# Patient Record
Sex: Male | Born: 1995
Health system: Southern US, Community
[De-identification: ages and names within clinical notes are randomized; demographics above are authoritative.]

---
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
## 2013-08-24 ENCOUNTER — Emergency Department: Payer: Self-pay | Admitting: Emergency Medicine

## 2018-09-16 DIAGNOSIS — S62340A Nondisplaced fracture of base of second metacarpal bone, right hand, initial encounter for closed fracture: Secondary | ICD-10-CM | POA: Insufficient documentation

## 2019-09-02 ENCOUNTER — Other Ambulatory Visit: Payer: Self-pay

## 2019-09-02 ENCOUNTER — Ambulatory Visit: Payer: HRSA Program | Attending: Internal Medicine

## 2019-09-02 DIAGNOSIS — Z20828 Contact with and (suspected) exposure to other viral communicable diseases: Secondary | ICD-10-CM | POA: Diagnosis not present

## 2019-09-02 DIAGNOSIS — Z20822 Contact with and (suspected) exposure to covid-19: Secondary | ICD-10-CM

## 2019-09-03 NOTE — Progress Notes (Signed)
Order(s) created erroneously. Erroneous order ID: [id number]  Order moved by: Lynzy Rawles M  Order move date/time: 09/03/2019 6:56 PM  Source Patient: [id number]  Source Contact: 09/02/2019  Destination Patient: [id number]  Destination Contact: 09/02/2019 

## 2019-09-03 NOTE — Progress Notes (Signed)
Order(s) created erroneously. Erroneous order ID: [id number]  Order moved by: Brigitte Pulse  Order move date/time: 09/03/2019 6:56 PM  Source Patient: [id number]  Source Contact: 09/02/2019  Destination Patient: [id number]  Destination Contact: 09/02/2019

## 2019-09-03 NOTE — Progress Notes (Signed)
Moving orders to this encounter.  

## 2019-09-04 LAB — NOVEL CORONAVIRUS, NAA: SARS-CoV-2, NAA: NOT DETECTED

## 2020-04-08 ENCOUNTER — Other Ambulatory Visit: Payer: Self-pay

## 2020-04-08 DIAGNOSIS — F10929 Alcohol use, unspecified with intoxication, unspecified: Secondary | ICD-10-CM | POA: Insufficient documentation

## 2020-04-08 LAB — COMPREHENSIVE METABOLIC PANEL
ALT: 62 U/L — ABNORMAL HIGH (ref 0–44)
AST: 39 U/L (ref 15–41)
Albumin: 4.8 g/dL (ref 3.5–5.0)
Alkaline Phosphatase: 78 U/L (ref 38–126)
Anion gap: 13 (ref 5–15)
BUN: 15 mg/dL (ref 6–20)
CO2: 22 mmol/L (ref 22–32)
Calcium: 8.8 mg/dL — ABNORMAL LOW (ref 8.9–10.3)
Chloride: 106 mmol/L (ref 98–111)
Creatinine, Ser: 0.91 mg/dL (ref 0.61–1.24)
GFR calc Af Amer: >60 mL/min (ref >60)
GFR calc non Af Amer: >60 mL/min (ref >60)
Glucose, Bld: 122 mg/dL — ABNORMAL HIGH (ref 70–99)
Potassium: 3.2 mmol/L — ABNORMAL LOW (ref 3.5–5.1)
Sodium: 141 mmol/L (ref 135–145)
Total Bilirubin: 0.6 mg/dL (ref 0.3–1.2)
Total Protein: 8 g/dL (ref 6.5–8.1)

## 2020-04-08 LAB — CBC
HCT: 41.4 % (ref 39.0–52.0)
Hemoglobin: 14.4 g/dL (ref 13.0–17.0)
MCH: 32.5 pg (ref 26.0–34.0)
MCHC: 34.8 g/dL (ref 30.0–36.0)
MCV: 93.5 fL (ref 80.0–100.0)
Platelets: 280 10*3/uL (ref 150–400)
RBC: 4.43 MIL/uL (ref 4.22–5.81)
RDW: 11.9 % (ref 11.5–15.5)
WBC: 11 10*3/uL — ABNORMAL HIGH (ref 4.0–10.5)
nRBC: 0 % (ref 0.0–0.2)

## 2020-04-08 LAB — SALICYLATE LEVEL: Salicylate Lvl: <7 mg/dL — ABNORMAL LOW (ref 7.0–30.0)

## 2020-04-08 LAB — ETHANOL: Alcohol, Ethyl (B): 227 mg/dL — ABNORMAL HIGH (ref <10)

## 2020-04-08 LAB — ACETAMINOPHEN LEVEL: Acetaminophen (Tylenol), Serum: <10 ug/mL — ABNORMAL LOW (ref 10–30)

## 2020-04-08 MED ORDER — SODIUM CHLORIDE 0.9 % IV BOLUS
1000.0000 mL | Freq: Once | INTRAVENOUS | Status: AC
  Administered 2020-04-08: 1000 mL via INTRAVENOUS

## 2020-04-08 MED ORDER — ONDANSETRON HCL 4 MG/2ML IJ SOLN
INTRAMUSCULAR | Status: AC
  Administered 2020-04-08: 4 mg via INTRAVENOUS
  Filled 2020-04-08: qty 2

## 2020-04-08 MED ORDER — ONDANSETRON HCL 4 MG/2ML IJ SOLN: 4.0000 mg | Freq: Once | INTRAMUSCULAR | Status: AC

## 2020-04-08 NOTE — ED Triage Notes (Signed)
Patient brought in by friends for alcohol intoxication. Patient unconscious at arrival to this ED. Patient awakened while trying to transfer patient from vehicle. Patient unconsciouis for approx 30 minutes. Patient actively vomiting at this time. Patient unsure of how much he drank tonight.

## 2020-04-09 ENCOUNTER — Emergency Department
Admission: EM | Admit: 2020-04-09 | Discharge: 2020-04-09 | Disposition: A | Discharge status: Home / Self Care | Payer: Self-pay | Attending: Emergency Medicine | Admitting: Emergency Medicine

## 2020-04-09 DIAGNOSIS — F10929 Alcohol use, unspecified with intoxication, unspecified: Secondary | ICD-10-CM

## 2020-04-09 NOTE — ED Notes (Signed)
Patient's mother dropped off bag of clean clothing for him. Placed on stretcher beside of patient.

## 2020-04-09 NOTE — ED Provider Notes (Signed)
[facility] Emergency Department Provider Note  ____________________________________________  Time seen: Approximately 4:00 AM  I have reviewed the triage vital signs and the nursing notes.   HISTORY  Chief Complaint Alcohol Intoxication   HPI Blake Morales. is a 24 y.o. male was brought in by his uncle for alcohol intoxication.  Patient reports that he went to a restaurant by himself.  He had a few drinks and he was eating.  He reports that he does not remember what happened after that.  According to the uncle patient called because he was intoxicated.  On arrival patient was unresponsive in the car.  While being moved into the emergency room patient became more awake responsive.  No respiratory depression.  At this time patient has been sobering up for 6 hours.  He has been reassessed and denies any drug use.  He denies any history of alcoholism.  He denies any headache, chest pain, abdominal pain.  Patient feels back to baseline.   PMH None - reviewed  Allergies Patient has no known allergies.  No family history on file.  Social History Social History   Tobacco Use  . Smoking status: Never Smoker  . Smokeless tobacco: Never Used  Substance Use Topics  . Alcohol use: Yes  . Drug use: Not on file    Review of Systems  Constitutional: Negative for fever. + alcohol intoxication Eyes: Negative for visual changes. ENT: Negative for sore throat. Neck: No neck pain  Cardiovascular: Negative for chest pain. Respiratory: Negative for shortness of breath. Gastrointestinal: Negative for abdominal pain or diarrhea. + N.V Genitourinary: Negative for dysuria. Musculoskeletal: Negative for back pain. Skin: Negative for rash. Neurological: Negative for headaches, weakness or numbness. Psych: No SI or HI  ____________________________________________   PHYSICAL EXAM:  VITAL SIGNS: ED Triage Vitals  Enc Vitals Group     BP 04/08/20 2228 123/78      Pulse Rate 04/08/20 2228 81     Resp 04/08/20 2228 12     Temp 04/08/20 2228 97.9 F (36.6 C)     Temp Source 04/08/20 2228 Axillary     SpO2 04/08/20 2228 91 %     Weight 04/08/20 2218 175 lb (79.4 kg)     Height 04/08/20 2218 5\' 4"  (1.626 m)     Head Circumference --      Peak Flow --      Pain Score 04/08/20 2218 0     Pain Loc --      Pain Edu? --      Excl. in GC? --     Constitutional: Alert and oriented. Well appearing and in no apparent distress. HEENT:      Head: Normocephalic and atraumatic.         Eyes: Conjunctivae are normal. Sclera is non-icteric.       Mouth/Throat: Mucous membranes are moist.       Neck: Supple with no signs of meningismus. Cardiovascular: Regular rate and rhythm. No murmurs, gallops, or rubs.  Respiratory: Normal respiratory effort. Lungs are clear to auscultation bilaterally.  Gastrointestinal: Soft, non tender. Musculoskeletal: No edema, cyanosis, or erythema of extremities. Neurologic: Normal speech and language. Face is symmetric. Moving all extremities. No gross focal neurologic deficits are appreciated. Skin: Skin is warm, dry and intact. No rash noted. Psychiatric: Mood and affect are normal. Speech and behavior are normal.  ____________________________________________   LABS (all labs ordered are listed, but only abnormal results are displayed)  Labs Reviewed  COMPREHENSIVE METABOLIC PANEL - Abnormal; Notable for the following components:      Result Value   Potassium 3.2 (*)    Glucose, Bld 122 (*)    Calcium 8.8 (*)    ALT 62 (*)    All other components within normal limits  ETHANOL - Abnormal; Notable for the following components:   Alcohol, Ethyl (B) 227 (*)    All other components within normal limits  SALICYLATE LEVEL - Abnormal; Notable for the following components:   Salicylate Lvl <7.0 (*)    All other components within normal limits  ACETAMINOPHEN LEVEL - Abnormal; Notable for the following components:    Acetaminophen (Tylenol), Serum <10 (*)    All other components within normal limits  CBC - Abnormal; Notable for the following components:   WBC 11.0 (*)    All other components within normal limits  URINE DRUG SCREEN, QUALITATIVE ([facility] ONLY)   ____________________________________________  EKG  none  ____________________________________________  RADIOLOGY  none  ____________________________________________   PROCEDURES  Procedure(s) performed: None Procedures Critical Care performed:  None ____________________________________________   INITIAL IMPRESSION / ASSESSMENT AND PLAN / ED COURSE  24 y.o. male was brought in by his uncle for alcohol intoxication.  Patient has several drinks tonight at a restaurant.  Upon arrival patient was extremely intoxicated but always maintaining his airway.  He was monitored for 6 hours and is now completely sober.  He is ambulatory.  He is tolerating p.o.  He has no medical complaints.  This was an accidental overdose.  No suicide intent.  This is never happened patient before.  He is going to be discharged down to the care of his uncle.  Discussed harms associated with heavy consumption of alcohol.  Old medical records reviewed.      _____________________________________________ Please note:  Patient was evaluated in Emergency Department today for the symptoms described in the history of present illness. Patient was evaluated in the context of the global COVID-19 pandemic, which necessitated consideration that the patient might be at risk for infection with the SARS-CoV-2 virus that causes COVID-19. Institutional protocols and algorithms that pertain to the evaluation of patients at risk for COVID-19 are in a state of rapid change based on information released by regulatory bodies including the CDC and federal and state organizations. These policies and algorithms were followed during the patient's care in the ED.  Some ED evaluations and  interventions may be delayed as a result of limited staffing during the pandemic.   [place] Controlled Substance Database was reviewed by me. ____________________________________________   FINAL CLINICAL IMPRESSION(S) / ED DIAGNOSES   Final diagnoses:  Alcoholic intoxication with complication (HCC)      NEW MEDICATIONS STARTED DURING THIS VISIT:  ED Discharge Orders    None       Note:  This document was prepared using Dragon voice recognition software and may include unintentional dictation errors.    Don Perking, [place], MD 04/09/20 [phone]

## 2020-04-09 NOTE — ED Notes (Signed)
Spoke with patient's mother who is coming to pick him up. Will go over discharge papers with patient when mother arrives.

## 2020-04-09 NOTE — ED Notes (Signed)
Patient continues to sleep soundly. Will arouse to touch.

## 2020-04-09 NOTE — ED Notes (Signed)
Patient awake and alert. Cannot recall exactly what happed at the restaurant but states he was by himself and stood up to go to the bathroom and "I guess it just all hit me." Able to ambulate to the bathroom without assistance. MD to bedside to evaluate and discharge patient. Will call his mother to pick him up. Patient given clothes that his mother brought to change into.

## 2020-11-19 ENCOUNTER — Emergency Department (HOSPITAL_BASED_OUTPATIENT_CLINIC_OR_DEPARTMENT_OTHER)
Admission: EM | Admit: 2020-11-19 | Discharge: 2020-11-20 | Disposition: A | Discharge status: Home / Self Care | Payer: Self-pay | Attending: Emergency Medicine | Admitting: Emergency Medicine

## 2020-11-19 ENCOUNTER — Encounter (HOSPITAL_BASED_OUTPATIENT_CLINIC_OR_DEPARTMENT_OTHER): Payer: Self-pay | Admitting: *Deleted

## 2020-11-19 ENCOUNTER — Other Ambulatory Visit: Payer: Self-pay

## 2020-11-19 DIAGNOSIS — S0012XA Contusion of left eyelid and periocular area, initial encounter: Secondary | ICD-10-CM

## 2020-11-19 DIAGNOSIS — W500XXA Accidental hit or strike by another person, initial encounter: Secondary | ICD-10-CM | POA: Insufficient documentation

## 2020-11-19 DIAGNOSIS — S01112A Laceration without foreign body of left eyelid and periocular area, initial encounter: Secondary | ICD-10-CM

## 2020-11-19 DIAGNOSIS — Y9369 Activity, other involving other sports and athletics played as a team or group: Secondary | ICD-10-CM | POA: Insufficient documentation

## 2020-11-19 MED ORDER — LIDOCAINE-EPINEPHRINE 2 %-1:100000 IJ SOLN
20.0000 mL | Freq: Once | INTRAMUSCULAR | Status: AC
  Administered 2020-11-20: 20 mL

## 2020-11-19 MED ORDER — LIDOCAINE-EPINEPHRINE (PF) 2 %-1:200000 IJ SOLN
INTRAMUSCULAR | Status: AC
  Filled 2020-11-19: qty 20

## 2020-11-19 NOTE — ED Provider Notes (Incomplete)
   [facility] Provider Note: [name], MD, FACEP  CSN: [id number] MRN: [id number] ARRIVAL: 11/19/20 at 2258 ROOM: MH12/MH12   CHIEF COMPLAINT  Laceration   HISTORY OF PRESENT ILLNESS  11/19/20 11:39 PM Blake Morales. is a 25 y.o. male who collided with another player while playing laser tag earlier this evening.  He has a laceration of the left eyebrow.  Bleeding is controlled.  He rates associated pain is a 3 out of 10, worse with movement or palpation.    History reviewed. No pertinent past medical history.  History reviewed. No pertinent surgical history.  No family history on file.  Social History   Tobacco Use  . Smoking status: Never Smoker  . Smokeless tobacco: Never Used  Vaping Use  . Vaping Use: Never used  Substance Use Topics  . Alcohol use: Yes  . Drug use: Never    Prior to Admission medications   Not on File    Allergies Patient has no known allergies.   REVIEW OF SYSTEMS  Negative except as noted here or in the History of Present Illness.   PHYSICAL EXAMINATION  Initial Vital Signs Blood pressure 124/74, pulse (!) 130, temperature 98.3 F (36.8 C), temperature source Oral, resp. rate 20, height 5\' 4"  (1.626 m), weight 93 kg, SpO2 100 %.  Examination General: Well-developed, well-nourished male in no acute distress; appearance consistent with age of record HENT: normocephalic; atraumatic Eyes: pupils equal, round and reactive to light; extraocular muscles intact Neck: supple Heart: regular rate and rhythm; no murmurs, rubs or gallops Lungs: clear to auscultation bilaterally Abdomen: soft; nondistended; nontender; no masses or hepatosplenomegaly; bowel sounds present Extremities: No deformity; full range of motion; pulses normal Neurologic: Awake, alert and oriented; motor function intact in all extremities and symmetric; no facial droop Skin: Warm and dry Psychiatric: Normal mood and affect   RESULTS  Summary of  this visit's results, reviewed and interpreted by myself:   EKG Interpretation  Date/Time:    Ventricular Rate:    PR Interval:    QRS Duration:   QT Interval:    QTC Calculation:   R Axis:     Text Interpretation:        Laboratory Studies: No results found for this or any previous visit (from the past 24 hour(s)). Imaging Studies: No results found.  ED COURSE and MDM  Nursing notes, initial and subsequent vitals signs, including pulse oximetry, reviewed and interpreted by myself.  Vitals:   11/19/20 2309 11/19/20 2310  BP: 124/74   Pulse: (!) 130   Resp: 20   Temp: 98.3 F (36.8 C)   TempSrc: Oral   SpO2: 100%   Weight:  93 kg  Height:  5\' 4"  (1.626 m)   Medications - No data to display    PROCEDURES  Procedures   ED DIAGNOSES  No diagnosis found.

## 2020-11-19 NOTE — ED Provider Notes (Signed)
[facility] Provider Note: [name], MD, FACEP  CSN: [id number] MRN: [id number] ARRIVAL: 11/19/20 at 2258 ROOM: MH12/MH12   CHIEF COMPLAINT  Laceration   HISTORY OF PRESENT ILLNESS  11/19/20 11:39 PM Blake Morales. is a 25 y.o. male who collided with another player while playing laser tag earlier this evening.  He has a laceration of the left eyebrow.  Bleeding is controlled.  He rates associated pain is a 3 out of 10, worse with movement or palpation.  Tetanus is believed to be up-to-date as he was in the [employer].   History reviewed. No pertinent past medical history.  History reviewed. No pertinent surgical history.  No family history on file.  Social History   Tobacco Use  . Smoking status: Never Smoker  . Smokeless tobacco: Never Used  Vaping Use  . Vaping Use: Never used  Substance Use Topics  . Alcohol use: Yes  . Drug use: Never    Prior to Admission medications   Not on File    Allergies Patient has no known allergies.   REVIEW OF SYSTEMS  Negative except as noted here or in the History of Present Illness.   PHYSICAL EXAMINATION  Initial Vital Signs Blood pressure 124/74, pulse (!) 130, temperature 98.3 F (36.8 C), temperature source Oral, resp. rate 20, height 5\' 4"  (1.626 m), weight 93 kg, SpO2 100 %.  Examination General: Well-developed, well-nourished male in no acute distress; appearance consistent with age of record HENT: normocephalic; laceration left eyebrow:    Eyes: pupils equal, round and reactive to light; extraocular muscles intact Neck: supple Heart: regular rate and rhythm Lungs: clear to auscultation bilaterally Abdomen: soft; nondistended; nontender;  bowel sounds present Extremities: No deformity; full range of motion; pulses normal Neurologic: Awake, alert and oriented; motor function intact in all extremities and symmetric; no facial droop Skin: Warm and dry Psychiatric: Normal mood and  affect   RESULTS  Summary of this visit's results, reviewed and interpreted by myself:   EKG Interpretation  Date/Time:    Ventricular Rate:    PR Interval:    QRS Duration:   QT Interval:    QTC Calculation:   R Axis:     Text Interpretation:        Laboratory Studies: No results found for this or any previous visit (from the past 24 hour(s)). Imaging Studies: No results found.  ED COURSE and MDM  Nursing notes, initial and subsequent vitals signs, including pulse oximetry, reviewed and interpreted by myself.  Vitals:   11/19/20 2309 11/19/20 2310  BP: 124/74   Pulse: (!) 130   Resp: 20   Temp: 98.3 F (36.8 C)   TempSrc: Oral   SpO2: 100%   Weight:  93 kg  Height:  5\' 4"  (1.626 m)   Medications  lidocaine-EPINEPHrine (XYLOCAINE W/EPI) 2 %-1:100000 (with pres) injection 20 mL (has no administration in time range)  lidocaine-EPINEPHrine (XYLOCAINE W/EPI) 2 %-1:200000 (PF) injection (has no administration in time range)      PROCEDURES  Procedures LACERATION REPAIR Performed by: 01/19/21 Sabreen Kitchen Authorized by: Kyanna Mahrt Consent: Verbal consent obtained. Risks and benefits: risks, benefits and alternatives were discussed Consent given by: patient Patient identity confirmed: provided demographic data Prepped and Draped in normal sterile fashion Wound explored  Laceration Location: Left eyebrow  Laceration Length: 3.5 cm  No Foreign Bodies seen or palpated  Anesthesia: local infiltration  Local anesthetic: lidocaine 2% with epinephrine  Anesthetic total: 4 ml  Irrigation method: syringe Amount of cleaning: standard  Skin closure: 4-0 Vicryl  Number of sutures: 1  Technique: Running  Patient tolerance: Patient tolerated the procedure well with no immediate complications.     ED DIAGNOSES     ICD-10-CM   1. Laceration of left eyebrow, initial encounter  S01.112A   2. Contusion of left eyebrow, initial encounter  S00.12XA         Shiryl Ruddy, Jonny Ruiz, MD 11/20/20 [phone]

## 2020-11-19 NOTE — ED Triage Notes (Signed)
Pt reports he ran into another player while playing laser tag. Lac present to left eyebrow. Bleeding controlled

## 2021-02-16 ENCOUNTER — Other Ambulatory Visit: Payer: Self-pay

## 2021-02-16 ENCOUNTER — Emergency Department
Admission: EM | Admit: 2021-02-16 | Discharge: 2021-02-16 | Disposition: A | Discharge status: Home / Self Care | Payer: Self-pay | Attending: Emergency Medicine | Admitting: Emergency Medicine

## 2021-02-16 ENCOUNTER — Encounter: Payer: Self-pay | Admitting: Emergency Medicine

## 2021-02-16 ENCOUNTER — Emergency Department: Payer: Self-pay

## 2021-02-16 DIAGNOSIS — R1010 Upper abdominal pain, unspecified: Secondary | ICD-10-CM

## 2021-02-16 DIAGNOSIS — K8050 Calculus of bile duct without cholangitis or cholecystitis without obstruction: Secondary | ICD-10-CM | POA: Insufficient documentation

## 2021-02-16 DIAGNOSIS — K8020 Calculus of gallbladder without cholecystitis without obstruction: Secondary | ICD-10-CM | POA: Insufficient documentation

## 2021-02-16 LAB — CBC WITH DIFFERENTIAL/PLATELET
Abs Immature Granulocytes: 0.02 10*3/uL (ref 0.00–0.07)
Basophils Absolute: 0.1 10*3/uL (ref 0.0–0.1)
Basophils Relative: 1 %
Eosinophils Absolute: 0.2 10*3/uL (ref 0.0–0.5)
Eosinophils Relative: 2 %
HCT: 41.1 % (ref 39.0–52.0)
Hemoglobin: 14.4 g/dL (ref 13.0–17.0)
Immature Granulocytes: 0 %
Lymphocytes Relative: 50 %
Lymphs Abs: 4.8 10*3/uL — ABNORMAL HIGH (ref 0.7–4.0)
MCH: 33.5 pg (ref 26.0–34.0)
MCHC: 35 g/dL (ref 30.0–36.0)
MCV: 95.6 fL (ref 80.0–100.0)
Monocytes Absolute: 0.8 10*3/uL (ref 0.1–1.0)
Monocytes Relative: 9 %
Neutro Abs: 3.7 10*3/uL (ref 1.7–7.7)
Neutrophils Relative %: 38 %
Platelets: 254 10*3/uL (ref 150–400)
RBC: 4.3 MIL/uL (ref 4.22–5.81)
RDW: 11.6 % (ref 11.5–15.5)
WBC: 9.6 10*3/uL (ref 4.0–10.5)
nRBC: 0 % (ref 0.0–0.2)

## 2021-02-16 LAB — COMPREHENSIVE METABOLIC PANEL
ALT: 35 U/L (ref 0–44)
AST: 32 U/L (ref 15–41)
Albumin: 4.2 g/dL (ref 3.5–5.0)
Alkaline Phosphatase: 59 U/L (ref 38–126)
Anion gap: 9 (ref 5–15)
BUN: 18 mg/dL (ref 6–20)
CO2: 24 mmol/L (ref 22–32)
Calcium: 8.9 mg/dL (ref 8.9–10.3)
Chloride: 106 mmol/L (ref 98–111)
Creatinine, Ser: 0.86 mg/dL (ref 0.61–1.24)
GFR, Estimated: >60 mL/min (ref >60)
Glucose, Bld: 127 mg/dL — ABNORMAL HIGH (ref 70–99)
Potassium: 3.5 mmol/L (ref 3.5–5.1)
Sodium: 139 mmol/L (ref 135–145)
Total Bilirubin: 0.7 mg/dL (ref 0.3–1.2)
Total Protein: 7.5 g/dL (ref 6.5–8.1)

## 2021-02-16 LAB — LIPASE, BLOOD: Lipase: 33 U/L (ref 11–51)

## 2021-02-16 LAB — TROPONIN I (HIGH SENSITIVITY): Troponin I (High Sensitivity): <2 ng/L (ref <18)

## 2021-02-16 MED ORDER — OXYCODONE-ACETAMINOPHEN 5-325 MG PO TABS: 1.0000 | ORAL_TABLET | ORAL | 0 refills | Status: DC | PRN

## 2021-02-16 MED ORDER — OXYCODONE-ACETAMINOPHEN 5-325 MG PO TABS
1.0000 | ORAL_TABLET | Freq: Once | ORAL | Status: AC
  Administered 2021-02-16: 1 via ORAL
  Filled 2021-02-16: qty 1

## 2021-02-16 MED ORDER — ONDANSETRON 4 MG PO TBDP
4.0000 mg | ORAL_TABLET | Freq: Once | ORAL | Status: AC
  Administered 2021-02-16: 4 mg via ORAL
  Filled 2021-02-16: qty 1

## 2021-02-16 MED ORDER — ONDANSETRON 4 MG PO TBDP
4.0000 mg | ORAL_TABLET | Freq: Three times a day (TID) | ORAL | 0 refills | Status: DC | PRN
Start: 2021-02-16 — End: 2021-02-23

## 2021-02-16 NOTE — ED Notes (Signed)
Pt in u/s

## 2021-02-16 NOTE — ED Notes (Signed)
Pt reports upper abd pain for 1 hour.  No n/v/d.  No back pain.  Pt denies urinary sx.  Pt took no otc meds for discomfort.  Pt alert  Speech clear.  Pt in hallway bed.

## 2021-02-16 NOTE — Discharge Instructions (Signed)
1. Take medicines as needed for pain & nausea (Percocet/Zofran #30). 2. Clear liquids x 12 hours, then bland diet x 1 week, then slowly advance diet as tolerated. Avoid fatty, greasy, spicy foods and drinks. 3. Return to the ER for worsening symptoms, persistent vomiting, fever, difficulty breathing or other concerns.  

## 2021-02-16 NOTE — ED Triage Notes (Signed)
Pt ambulatory to triage without difficulty or distress noted; Pt reports upper abd pain radiating into back tonight; st similar episode earlier this week but no other hx of same; denies any accomp symptoms

## 2021-02-16 NOTE — ED Provider Notes (Signed)
[facility] Emergency Department Provider Note   ____________________________________________   Event Date/Time   First MD Initiated Contact with Patient 02/16/21 0155     (approximate)  I have reviewed the triage vital signs and the nursing notes.   HISTORY  Chief Complaint Abdominal Pain    HPI Blake Morales. is a 25 y.o. male who presents to the ED from home with a chief complaint of abdominal pain. Patient reports onset of pain approximately 10pm. Ate pizza for dinner. Reports upper abdominal pain, nonradiating, not associated with nausea or vomiting. Denies fever, chest pain, sob, diarrhea.      Past Medical History None  There are no problems to display for this patient.   History reviewed. No pertinent surgical history.  Prior to Admission medications   Not on File    Allergies Patient has no known allergies.  No family history on file.  Social History Social History   Tobacco Use  . Smoking status: Never Smoker  . Smokeless tobacco: Never Used  Vaping Use  . Vaping Use: Never used  Substance Use Topics  . Alcohol use: Yes    Comment: twice/week  . Drug use: Never    Review of Systems  Constitutional: No fever/chills Eyes: No visual changes. ENT: No sore throat. Cardiovascular: Denies chest pain. Respiratory: Denies shortness of breath. Gastrointestinal: Positive for abdominal pain.  No nausea, no vomiting.  No diarrhea.  No constipation. Genitourinary: Negative for dysuria. Musculoskeletal: Negative for back pain. Skin: Negative for rash. Neurological: Negative for headaches, focal weakness or numbness.   ____________________________________________   PHYSICAL EXAM:  VITAL SIGNS: ED Triage Vitals  Enc Vitals Group     BP 02/16/21 0018 126/88     Pulse Rate 02/16/21 0018 82     Resp 02/16/21 0018 20     Temp 02/16/21 0018 97.9 F (36.6 C)     Temp Source 02/16/21 0018 Oral     SpO2 02/16/21 0018  98 %     Weight 02/16/21 0019 212 lb (96.2 kg)     Height 02/16/21 0019 5\' 4"  (1.626 m)     Head Circumference --      Peak Flow --      Pain Score 02/16/21 0018 7     Pain Loc --      Pain Edu? --      Excl. in GC? --     Constitutional: Alert and oriented. Well appearing and in no acute distress. Eyes: Conjunctivae are normal. PERRL. EOMI. Head: Atraumatic. Nose: No congestion/rhinnorhea. Mouth/Throat: Mucous membranes are moist.   Neck: No stridor.   Cardiovascular: Normal rate, regular rhythm. Grossly normal heart sounds.  Good peripheral circulation. Respiratory: Normal respiratory effort.  No retractions. Lungs CTAB. Gastrointestinal: Soft and mildly tender to palpation epigastrium and right upper quadrant without rebound or guarding. No distention. No abdominal bruits. No CVA tenderness. Musculoskeletal: No lower extremity tenderness nor edema.  No joint effusions. Neurologic:  Normal speech and language. No gross focal neurologic deficits are appreciated. No gait instability. Skin:  Skin is warm, dry and intact. No rash noted. Psychiatric: Mood and affect are normal. Speech and behavior are normal.  ____________________________________________   LABS (all labs ordered are listed, but only abnormal results are displayed)  Labs Reviewed  CBC WITH DIFFERENTIAL/PLATELET - Abnormal; Notable for the following components:      Result Value   Lymphs Abs 4.8 (*)    All other components within normal limits  COMPREHENSIVE METABOLIC PANEL - Abnormal; Notable for the following components:   Glucose, Bld 127 (*)    All other components within normal limits  LIPASE, BLOOD  URINALYSIS, COMPLETE (UACMP) WITH MICROSCOPIC  TROPONIN I (HIGH SENSITIVITY)   ____________________________________________  EKG  ED ECG REPORT I, Evangeline Utley J, the attending physician, personally viewed and interpreted this ECG.   Date: 02/16/2021  EKG Time: 0017  Rate: 78  Rhythm: normal EKG, normal  sinus rhythm  Axis: RAD  Intervals:none  ST&T Change: Nonspecific ____________________________________________  RADIOLOGY I, Jeanne Terrance J, personally viewed and evaluated these images (plain radiographs) as part of my medical decision making, as well as reviewing the written report by the radiologist.  ED MD interpretation:  cholelithiasis  Official radiology report(s): US ABDOMEN LIMITED RUQ (LIVER/GB)  Result Date: 02/16/2021 CLINICAL DATA:  Epigastric pain EXAM: ULTRASOUND ABDOMEN LIMITED RIGHT UPPER QUADRANT COMPARISON:  None. FINDINGS: Gallbladder: Multiple shadowing stones including non mobile stone at gallbladder neck. Normal wall thickness. Negative sonographic Murphy. Common bile duct: Diameter: 4.6 mm Liver: Liver is echogenic. No focal hepatic abnormality. Portal vein is patent on color Doppler imaging with normal direction of blood flow towards the liver. Other: None. IMPRESSION: 1. Cholelithiasis with suspected non mobile stone at gallbladder neck but negative sonographic Murphy and no other sonographic features to suggest acute cholecystitis. 2. Echogenic liver consistent with steatosis Electronically Signed   By: Jasmine Pang M.D.   On: 02/16/2021 03:08    ____________________________________________   PROCEDURES  Procedure(s) performed (including Critical Care):  Procedures   ____________________________________________   INITIAL IMPRESSION / ASSESSMENT AND PLAN / ED COURSE  As part of my medical decision making, I reviewed the following data within the electronic MEDICAL RECORD NUMBER Nursing notes reviewed and incorporated, Labs reviewed, EKG interpreted, Old chart reviewed, Radiograph reviewed, Notes from prior ED visits and [place] Controlled Substance Database     25 year old male presenting with upper abdominal pain. Differential diagnosis includes, but is not limited to, biliary disease (biliary colic, acute cholecystitis, cholangitis, choledocholithiasis, etc),  intrathoracic causes for epigastric abdominal pain including ACS, gastritis, duodenitis, pancreatitis, small bowel or large bowel obstruction, abdominal aortic aneurysm, hernia, and ulcer(s).  Laboratory results unremarkable. Will obtain RUQ [place]. Patient declines analgesia.  Clinical Course as of 02/16/21 0338  Thu Feb 16, 2021  [id number] Updated patient on [place] results. Will discharge home on Percocet and Zofran to use as needed. Strict return precautions given. Patient verbalizes understanding and agrees with plan of care. [JS]    Clinical Course User Index [JS] Irean Hong, MD     ____________________________________________   FINAL CLINICAL IMPRESSION(S) / ED DIAGNOSES  Final diagnoses:  Pain of upper abdomen  Biliary colic  Calculus of gallbladder without cholecystitis without obstruction     ED Discharge Orders    None       Note:  This document was prepared using Dragon voice recognition software and may include unintentional dictation errors.   Irean Hong, MD 02/16/21 [phone]

## 2021-02-23 ENCOUNTER — Other Ambulatory Visit: Payer: Self-pay

## 2021-02-23 ENCOUNTER — Ambulatory Visit: Payer: Self-pay | Admitting: Surgery

## 2021-02-23 ENCOUNTER — Encounter: Payer: Self-pay | Admitting: Surgery

## 2021-02-23 VITALS — BP 139/95 | HR 109 | Temp 98.9°F | Ht 64.0 in | Wt 213.8 lb

## 2021-02-23 DIAGNOSIS — J302 Other seasonal allergic rhinitis: Secondary | ICD-10-CM | POA: Insufficient documentation

## 2021-02-23 DIAGNOSIS — K8010 Calculus of gallbladder with chronic cholecystitis without obstruction: Secondary | ICD-10-CM

## 2021-02-23 NOTE — Patient Instructions (Signed)
Our surgery scheduler will call you within 24-48 hours to schedule your surgery. Please have the Blue surgery sheet available when speaking with her.  Cholelithiasis  Cholelithiasis happens when gallstones form in the gallbladder. The gallbladder stores bile. Bile is a fluid that helps digest fats. Bile can harden and form into gallstones. If they cause a blockage, they can cause pain (gallbladder attack). What are the causes? This condition may be caused by:  Some blood diseases, such as sickle cell anemia.  Too much of a fat-like substance (cholesterol) in your bile.  Not enough bile salts in your bile. These salts help the body absorb and digest fats.  The gallbladder not emptying fully or often enough. This is common in pregnant women. What increases the risk? The following factors may make you more likely to develop this condition:  Being male.  Being pregnant many times.  Eating a lot of fried foods, fat, and refined carbs (refined carbohydrates).  Being very overweight (obese).  Being older than age 40.  Using medicines with male hormones in them for a long time.  Losing weight fast.  Having gallstones in your family.  Having some health problems, such as diabetes, Crohn's disease, or liver disease. What are the signs or symptoms? Often, there may be gallstones but no symptoms. These gallstones are called silent gallstones. If a gallstone causes a blockage, you may get sudden pain. The pain:  Can be in the upper right part of your belly (abdomen).  Normally comes at night or after you eat.  Can last an hour or more.  Can spread to your right shoulder, back, or chest.  Can feel like discomfort, burning, or fullness in the upper part of your belly (indigestion). If the blockage lasts more than a few hours, you can get an infection or swelling. You may:  Feel like you may vomit.  Vomit.  Feel bloated.  Have belly pain for 5 hours or more.  Feel tender  in your belly, often in the upper right part and under your ribs.  Have fever or chills.  Have skin or the white parts of your eyes turn yellow (jaundice).  Have dark pee (urine) or pale poop (stool). How is this treated? Treatment for this condition depends on how bad you feel. If you have symptoms, you may need:  Home care, if symptoms are not very bad. ? Do not eat for 12-24 hours. Drink only water and clear liquids. ? Start to eat simple or clear foods after 1 or 2 days. Try broths and crackers. ? You may need medicines for pain or stomach upset or both. ? If you have an infection, you will need antibiotics.  A hospital stay, if you have very bad pain or a very bad infection.  Surgery to remove your gallbladder. You may need this if: ? Gallstones keep coming back. ? You have very bad symptoms.  Medicines to break up gallstones. Medicines: ? Are best for small gallstones. ? May be used for up to 6-12 months.  A procedure to find and take out gallstones or to break up gallstones. Follow these instructions at home: Medicines  Take over-the-counter and prescription medicines only as told by your doctor.  If you were prescribed an antibiotic medicine, take it as told by your doctor. Do not stop taking the antibiotic even if you start to feel better.  Ask your doctor if the medicine prescribed to you requires you to avoid driving or using machinery. Eating and   drinking  Drink enough fluid to keep your urine pale yellow. Drink water or clear fluids. This is important when you have pain.  Eat healthy foods. Choose: ? Fewer fatty foods, such as fried foods. ? Fewer refined carbs. Avoid breads and grains that are highly processed, such as white bread and white rice. Choose whole grains, such as whole-wheat bread and brown rice. ? More fiber. Almonds, fresh fruit, and beans are healthy sources. General instructions  Keep a healthy weight.  Keep all follow-up visits as told by  your doctor. This is important. Where to find more information  National Institute of Diabetes and Digestive and Kidney Diseases: www.niddk.nih.gov Contact a doctor if:  You have sudden pain in the upper right part of your belly. Pain might spread to your right shoulder, back, or chest.  You have been diagnosed with gallstones that have no symptoms and you get: ? Belly pain. ? Discomfort, burning, or fullness in the upper part of your abdomen.  You have dark urine or pale stools. Get help right away if:  You have sudden pain in the upper right part of your abdomen, and the pain lasts more than 2 hours.  You have pain in your abdomen, and: ? It lasts more than 5 hours. ? It keeps getting worse.  You have a fever or chills.  You keep feeling like you may vomit.  You keep vomiting.  Your skin or the white parts of your eyes turn yellow. Summary  Cholelithiasis happens when gallstones form in the gallbladder.  This condition may be caused by a blood disease, too much of a fat-like substance in the bile, or not enough bile salts in bile.  Treatment for this condition depends on how bad you feel.  If you have symptoms, do not eat or drink. You may need medicines. You may need a hospital stay for very bad pain or a very bad infection.  You may need surgery if gallstones keep coming back or if you have very bad symptoms. This information is not intended to replace advice given to you by your health care provider. Make sure you discuss any questions you have with your health care provider. Document Revised: 10/23/2019 Document Reviewed: 07/27/2019 Elsevier Patient Education  2021 [employer]  

## 2021-02-23 NOTE — Progress Notes (Signed)
Patient ID: Blake Ou., male   DOB: [birth year], 25 y.o.   MRN: [id number]  Chief Complaint: Right upper quadrant pain  History of Present Illness Blake Teall. is a 25 y.o. male with symptomatic calculus cholecystitis, based on ultrasound from June 2 visit to ED.  He reports this was his most significant attack followed tacos and pizza.  He had much Sudler episodes prior to this.  His right upper quadrant radiates to the back.  Reports some mild nausea since then but has been good at avoiding fatty foods.  Denies any history of jaundice or hepatitis..  Past Medical History History reviewed. No pertinent past medical history.    Past Surgical History:  Procedure Laterality Date   fractured arm Right     No Known Allergies  No current outpatient medications on file.   No current facility-administered medications for this visit.    Family History Family History  Problem Relation Age of Onset   Diabetes Mother       Social History Social History   Tobacco Use   Smoking status: Never   Smokeless tobacco: Never  Vaping Use   Vaping Use: Never used  Substance Use Topics   Alcohol use: Yes    Comment: twice/week   Drug use: Never        Review of Systems  Constitutional: Negative.   HENT: Negative.    Eyes: Negative.   Respiratory: Negative.    Cardiovascular: Negative.   Gastrointestinal:  Positive for nausea.  Genitourinary: Negative.   Skin: Negative.   Neurological: Negative.   Psychiatric/Behavioral: Negative.       Physical Exam Blood pressure (!) 139/95, pulse (!) 109, temperature 98.9 F (37.2 C), temperature source Oral, height 5\' 4"  (1.626 m), weight 213 lb 12.8 oz (97 kg), SpO2 94 %. Last Weight  Most recent update: 02/23/2021  2:45 PM    Weight  97 kg (213 lb 12.8 oz)             CONSTITUTIONAL: Well developed, and nourished, appropriately responsive and aware without distress.   EYES: Sclera non-icteric.   EARS, NOSE, MOUTH AND  THROAT: Mask worn.   The oropharynx is clear.   Hearing is intact to voice.  NECK: Trachea is midline, and there is no jugular venous distension.  LYMPH NODES:  Lymph nodes in the neck are not enlarged. RESPIRATORY:  Lungs are clear, and breath sounds are equal bilaterally. Normal respiratory effort without pathologic use of accessory muscles. CARDIOVASCULAR: Heart is regular in rate and rhythm. GI: The abdomen is soft, nontender, and nondistended. There were no palpable masses. I did not appreciate hepatosplenomegaly. There were normal bowel sounds. MUSCULOSKELETAL:  Symmetrical muscle tone appreciated in all four extremities.    SKIN: Skin turgor is normal. No pathologic skin lesions appreciated.  NEUROLOGIC:  Motor and sensation appear grossly normal.  Cranial nerves are grossly without defect. PSYCH:  Alert and oriented to person, place and time. Affect is appropriate for situation.  Data Reviewed I have personally reviewed what is currently available of the patient's imaging, recent labs and medical records.   Labs:  CBC Latest Ref Rng & Units 02/16/2021 04/08/2020  WBC 4.0 - 10.5 K/uL 9.6 11.0(H)  Hemoglobin 13.0 - 17.0 g/dL 04/10/2020 56.8  Hematocrit 12.7 - 52.0 % 41.1 41.4  Platelets 150 - 400 K/uL 254 280   CMP Latest Ref Rng & Units 02/16/2021 04/08/2020  Glucose 70 - 99 mg/dL 04/10/2020) 001(V)  BUN 6 -  20 mg/dL 18 15  Creatinine 4.94 - 1.24 mg/dL 4.96 7.59  Sodium 163 - 145 mmol/L 139 141  Potassium 3.5 - 5.1 mmol/L 3.5 3.2(L)  Chloride 98 - 111 mmol/L 106 106  CO2 22 - 32 mmol/L 24 22  Calcium 8.9 - 10.3 mg/dL 8.9 8.4(Y)  Total Protein 6.5 - 8.1 g/dL 7.5 8.0  Total Bilirubin 0.3 - 1.2 mg/dL 0.7 0.6  Alkaline Phos 38 - 126 U/L 59 78  AST 15 - 41 U/L 32 39  ALT 0 - 44 U/L 35 62(H)      Imaging: Radiology review:  CLINICAL DATA:  Epigastric pain   EXAM: ULTRASOUND ABDOMEN LIMITED RIGHT UPPER QUADRANT   COMPARISON:  None.   FINDINGS: Gallbladder:   Multiple shadowing stones  including non mobile stone at gallbladder neck. Normal wall thickness. Negative sonographic Murphy.   Common bile duct:   Diameter: 4.6 mm   Liver:   Liver is echogenic. No focal hepatic abnormality. Portal vein is patent on color Doppler imaging with normal direction of blood flow towards the liver.   Other: None.   IMPRESSION: 1. Cholelithiasis with suspected non mobile stone at gallbladder neck but negative sonographic Murphy and no other sonographic features to suggest acute cholecystitis. 2. Echogenic liver consistent with steatosis     Electronically Signed   By: Jasmine Pang M.D.   On: 02/16/2021 03:08 Within last 24 hrs: No results found.  Assessment    Chronic calculus cholecystitis with biliary colic Patient Active Problem List   Diagnosis Date Noted   Seasonal allergies 02/23/2021   Nondisplaced fracture of base of second metacarpal bone of right hand 09/16/2018    Plan    Robotic cholecystectomy. This was discussed thoroughly.  Optimal plan is for robotic cholecystectomy.  Risks and benefits have been discussed with the patient which include but are not limited to anesthesia, bleeding, infection, biliary ductal injury or stenosis, other associated unanticipated injuries affiliated with laparoscopic surgery.  I believe there is the desire to proceed, interpreter utilized as needed.  Questions elicited and answered to satisfaction.  No guarantees ever expressed or implied.   Face-to-face time spent with the patient and accompanying care providers(if present) was 20 minutes, with more than 50% of the time spent counseling, educating, and coordinating care of the patient.    These notes generated with voice recognition software. I apologize for typographical errors.  Campbell Lerner M.D., FACS 02/23/2021, 3:15 PM

## 2021-02-27 ENCOUNTER — Telehealth: Payer: Self-pay | Admitting: Surgery

## 2021-02-27 NOTE — Telephone Encounter (Signed)
Left message for patient to call to see if he is now ready to schedule surgery.

## 2021-03-06 ENCOUNTER — Telehealth: Payer: Self-pay | Admitting: Surgery

## 2021-03-06 NOTE — Telephone Encounter (Signed)
Outgoing call is made again.  I was able to speak with the patient today.  He states "I'm trying to work something out on my end".  York Spaniel he will call us if he decides to proceed with surgery.

## 2021-03-14 ENCOUNTER — Telehealth: Payer: Self-pay | Admitting: Surgery

## 2021-03-14 NOTE — Telephone Encounter (Signed)
To date patient has yet to return any of my calls regarding scheduling of his surgery.  Initially he was given CPT codes and number for the cost estimate line as he is self pay.  To date patient hasn't called back.  Numerous messages have been left.

## 2022-04-16 IMAGING — US US ABDOMEN LIMITED
1 series · 14 of 25 positions shown · non-contrast
Comparison: None.

CLINICAL DATA: Epigastric pain

EXAM:
ULTRASOUND ABDOMEN LIMITED RIGHT UPPER QUADRANT

[Series 1: us abdomen limited ruq (liver/gb) · 14 of 43 slices shown]
[im 1/43]
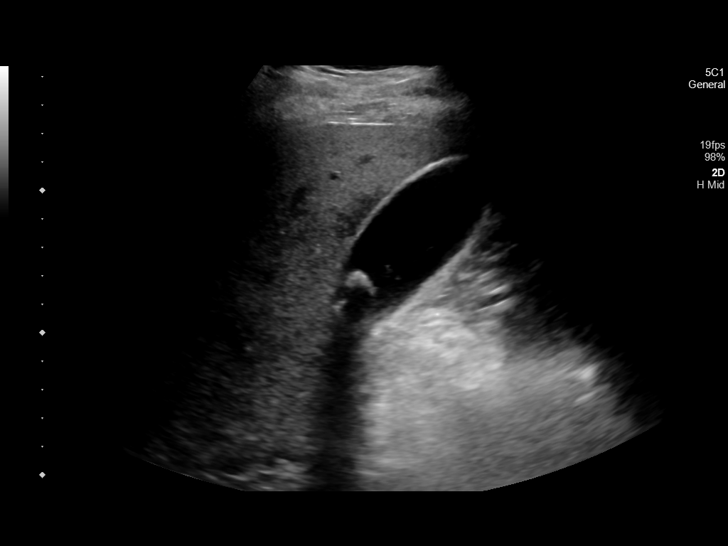
[im 4/43]
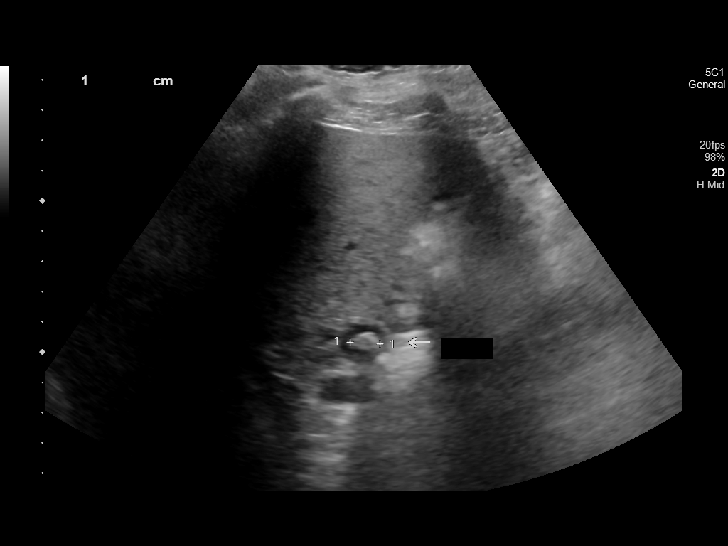
[im 8/43]
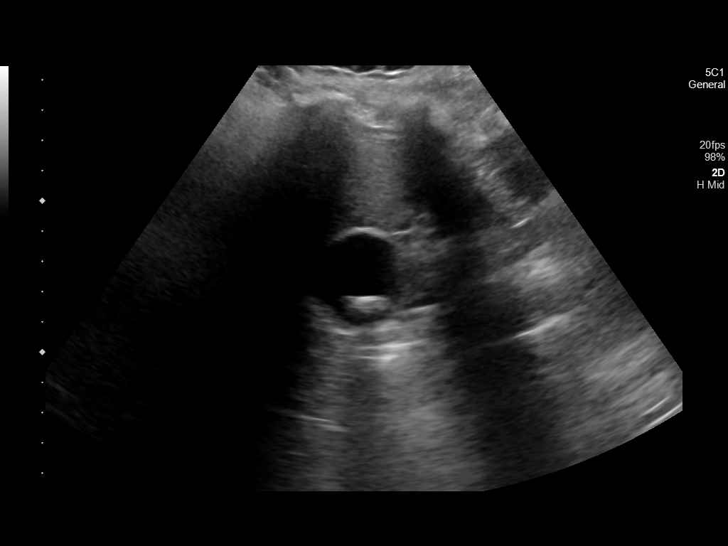
[im 11/43]
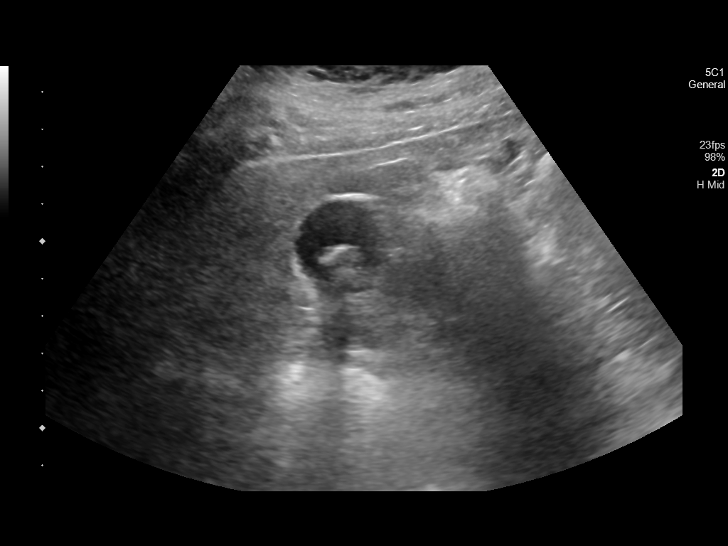
[im 15/43]
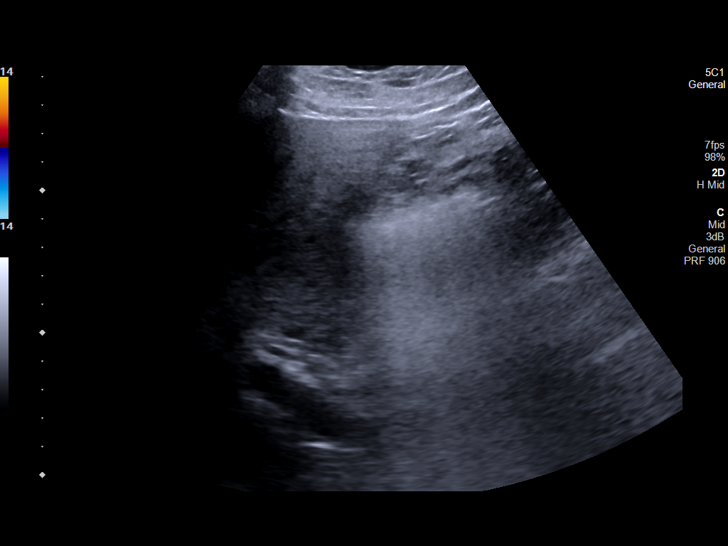
[im 16/43]
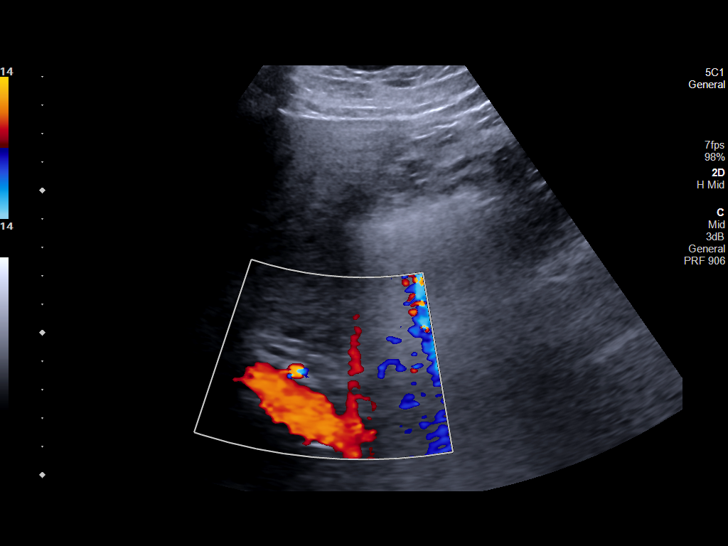
[im 20/43]
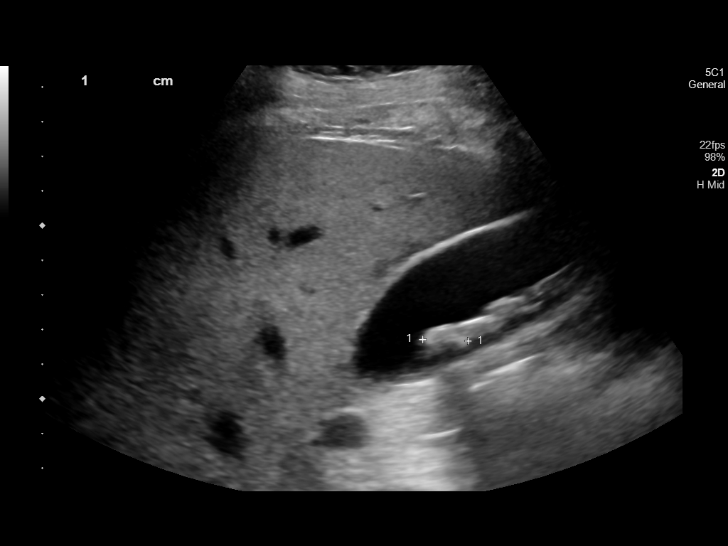
[im 23/43]
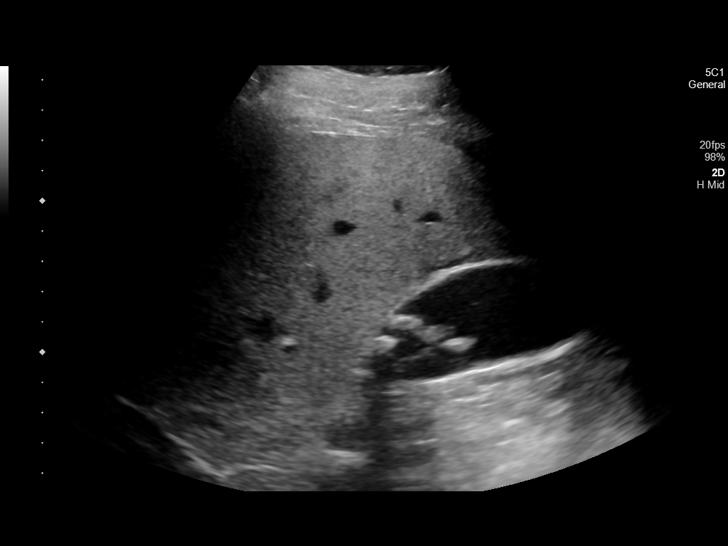
[im 27/43]
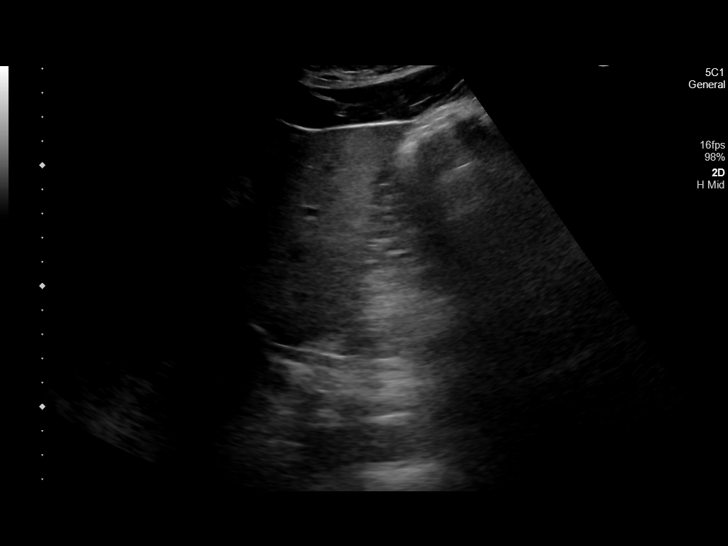
[im 29/43]
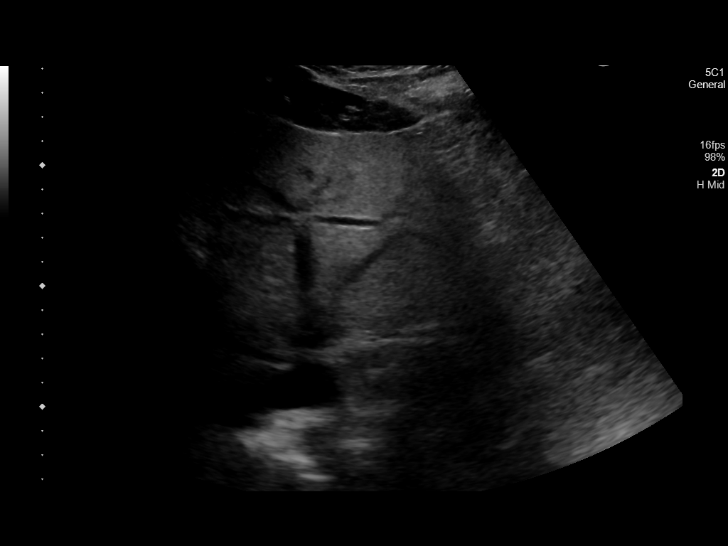
[im 32/43]
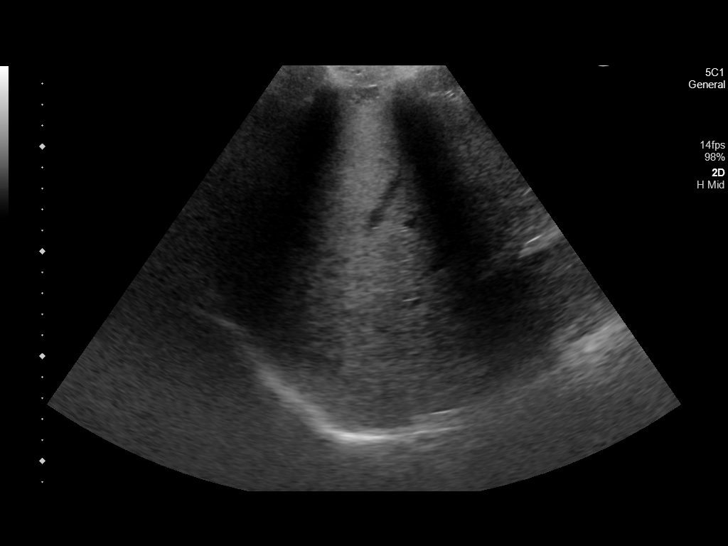
[im 36/43]
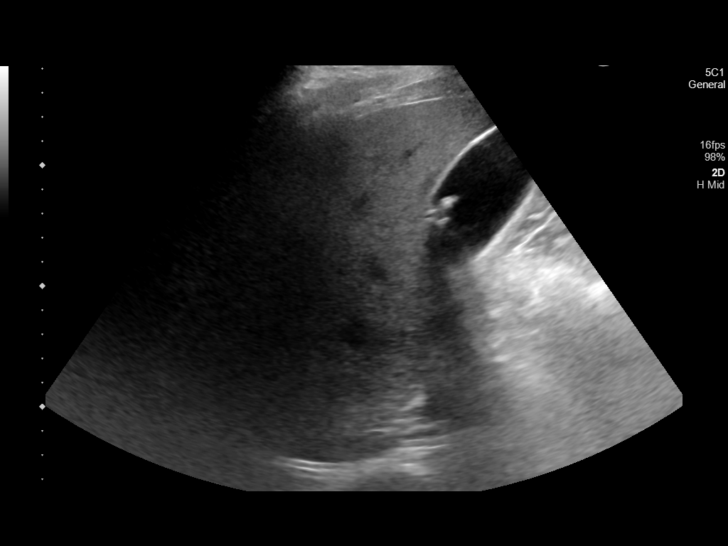
[im 39/43]
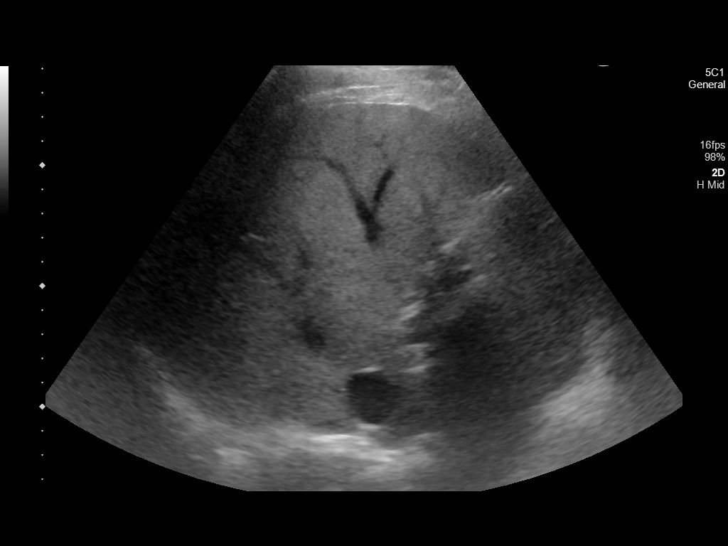
[im 43/43]
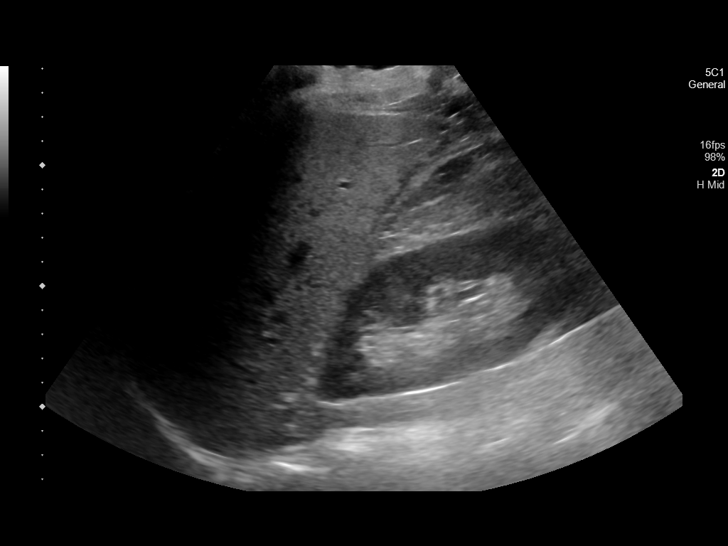

[14 of 25 positions shown; findings below may reference images not displayed]

FINDINGS: Gallbladder:

Multiple shadowing stones including non mobile stone at gallbladder
neck. Normal wall thickness. Negative sonographic Murphy.

Common bile duct:

Diameter: 4.6 mm

Liver:

Liver is echogenic. No focal hepatic abnormality. Portal vein is
patent on color Doppler imaging with normal direction of blood flow
towards the liver.

Other: None.
IMPRESSION: 1. Cholelithiasis with suspected non mobile stone at gallbladder
neck but negative sonographic Murphy and no other sonographic
features to suggest acute cholecystitis.
2. Echogenic liver consistent with steatosis
